# Patient Record
Sex: Female | Born: 2005 | Race: Black or African American | Hispanic: No | Marital: Single | Smoking: Never smoker
Health system: Southern US, Community
[De-identification: ages and names within clinical notes are randomized; demographics above are authoritative.]

## PROBLEM LIST (undated history)

## (undated) DIAGNOSIS — R4587 Impulsiveness: Secondary | ICD-10-CM

## (undated) DIAGNOSIS — F32A Depression, unspecified: Secondary | ICD-10-CM

## (undated) DIAGNOSIS — R45851 Suicidal ideations: Secondary | ICD-10-CM

## (undated) DIAGNOSIS — F419 Anxiety disorder, unspecified: Secondary | ICD-10-CM

## (undated) DIAGNOSIS — F909 Attention-deficit hyperactivity disorder, unspecified type: Secondary | ICD-10-CM

## (undated) DIAGNOSIS — G4700 Insomnia, unspecified: Secondary | ICD-10-CM

---
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
## 8387-02-24 DEATH — deceased
